# Patient Record
Sex: Female | Born: 2000 | Race: White | Hispanic: No | Marital: Single | Smoking: Never smoker
Health system: Southern US, Community
[De-identification: ages and names within clinical notes are randomized; demographics above are authoritative.]

## PROBLEM LIST (undated history)

## (undated) HISTORY — PX: WISDOM TOOTH EXTRACTION: SHX21

---
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
## 2021-03-31 ENCOUNTER — Encounter (INDEPENDENT_AMBULATORY_CARE_PROVIDER_SITE_OTHER): Payer: Self-pay | Admitting: Pediatrics

## 2021-03-31 ENCOUNTER — Other Ambulatory Visit: Payer: Self-pay

## 2021-03-31 ENCOUNTER — Ambulatory Visit (INDEPENDENT_AMBULATORY_CARE_PROVIDER_SITE_OTHER): Payer: 59 | Admitting: Pediatrics

## 2021-03-31 VITALS — BP 118/72 | HR 76 | Ht 65.95 in | Wt 132.8 lb

## 2021-03-31 DIAGNOSIS — Z23 Encounter for immunization: Secondary | ICD-10-CM

## 2021-03-31 DIAGNOSIS — N926 Irregular menstruation, unspecified: Secondary | ICD-10-CM | POA: Diagnosis not present

## 2021-03-31 DIAGNOSIS — R7989 Other specified abnormal findings of blood chemistry: Secondary | ICD-10-CM | POA: Diagnosis not present

## 2021-03-31 DIAGNOSIS — Z840 Family history of diseases of the skin and subcutaneous tissue: Secondary | ICD-10-CM

## 2021-03-31 DIAGNOSIS — R768 Other specified abnormal immunological findings in serum: Secondary | ICD-10-CM | POA: Diagnosis not present

## 2021-03-31 NOTE — Progress Notes (Addendum)
Pediatric Endocrinology Consultation Follow-Up Visit  Dorothy, Yoder 2001-03-09  Dorothy Daniel, PA-C  Chief Complaint: enlarged thyroid, elevated TSH, thyroid antibody positive  HPI: Dorothy Yoder is a 20 y.o. female presenting for follow-up of the above concerns.  she attended this visit alone.       1. Dorothy Yoder was initially referred to [facility] ([facility]) in 11/2017 for evaluation of thyromegaly and elevated TSH.  Dorothy Yoder's grandmother first noted her neck was enlarged around July 4th 2019.  Mom discussed this with Dr. Alita Chyle at her [facility] on 10/27/17.  Dr. Alita Chyle noted thyroid was of "generous size" so thyroid function tests were drawn on 10/31/17 showing elevated TSH of 7.73 (0.5-4.3) with normal FT4 of 1 (0.8-1.4). She was referred to [facility] for further evaluation.  At her initial visit on 12/06/17, thyroid function tests were normal (TSH 3.58, FT4 1, T4 7) with mildly positive TPO Ab at 14 and thyroglobulin Ab mildly positive at 3.  Her thyromegaly had improved at that time so clinical monitoring was recommended.  She had worsening thyromegaly 02/2019 with elevated TSH to 7.77 so was started on a low dose of levothyroxine.  This was discontinued shortly thereafter as she develop headaches while taking it and repeat thyroid labs were normal off levothyroxine.  2. Since last visit on 10/21/20, she has been well.    Has felt that thyroid has been the most swollen it has ever been over the past 3 weeks.  Felt like she was choking.  Not super stressed.  Has gotten better in the past 4 days. Prior to nnoting swelling, she did have a cough.  She has been trialed on a low dose of levothyroxine in the past though did not tolerate it.  Mom notes that her aunt has always had a hard time with generic levothyroxine but tolerated synthroid well.   Thyroid symptoms: Heat or cold intolerance: has been cold recently, then waking up sweating.  Hands also more clammy. Weight changes:  Weight has increased 6lb since last visit. Has been eating more than normal.   Energy level: has been more sleepy Sleep: napping much more often Skin changes: More dry Constipation/Diarrhea: + diarrhea x 1 week, then constipated.  Pain in L abd.  Drinking same amount as usual.  Difficulty swallowing: Problems swallowing as above.   Neck swelling: See above Periods regular: on OCPs, see below  There is a family history of a maternal aunt who is on Synthroid for hypothyroidism after receiving radiation treatment for lymphoma.  A separate maternal aunt has had several positive autoimmune markers though no specific diagnosis has been made.  Dad also has psoriatic arthritis treated with humira.  Continues on OCPs.  Last period was late, then lasted 12 days.  She has not been taking her OCPs as consistently as she should be.  ROS: All systems reviewed with pertinent positives listed below; otherwise negative. Vaccinated against influenza: no; wants a flu shot today.  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  Past Medical History:  History reviewed. No pertinent past medical history.   Hx of thyromegaly and elevated TSH with positive thyroid antibodies  Meds: Outpatient Encounter Medications as of 03/31/2021  Medication Sig   clindamycin (CLINDAGEL) 1 % gel APPLY A THIN LAYER TO THE AFFECTED AREA ONCE A DAY IN THE MORNING   Clobetasol Propionate 0.05 % shampoo Apply topically daily as needed.   norethindrone-ethinyl estradiol-iron (LOESTRIN FE) 1.5-30 MG-MCG tablet Take 1 tablet  by mouth daily.   tretinoin (RETIN-A) 0.025 % cream Apply topically.   No facility-administered encounter medications on file as of 03/31/2021.   Allergies: No Known Allergies  Surgical History: Past Surgical History:  Procedure Laterality Date   WISDOM TOOTH EXTRACTION     Family History:  Family History  Problem Relation Age of Onset   Psoriasis Father    Hypothyroidism Maternal  Aunt        Hypothyroid diagnosed after receiving radiation for lymphoma   Hypertension Maternal Grandmother    Heart disease Maternal Grandfather    Diabetes Maternal Grandfather    Hypertension Paternal Grandmother    Dad with psoriatic arthritis treated with humira  Social History: Lives with: Mother, father, 2 younger brothers Sophomore at [employer], joined a sorority.  Physical Exam:  Vitals:   03/31/21 1012  BP: 118/72  Pulse: 76  Weight: 132 lb 12.8 oz (60.2 kg)  Height: 5' 5.95" (1.675 m)   BP 118/72    Pulse 76    Ht 5' 5.95" (1.675 m)    Wt 132 lb 12.8 oz (60.2 kg)    LMP 03/13/2021    BMI 21.47 kg/m  Body mass index: body mass index is 21.47 kg/m. Blood pressure percentiles are not available for patients who are 18 years or older.  Wt Readings from Last 3 Encounters:  03/31/21 132 lb 12.8 oz (60.2 kg) (60 %, Z= 0.24)*  10/21/20 126 lb 9.6 oz (57.4 kg) (50 %, Z= 0.01)*  04/17/20 126 lb 12.8 oz (57.5 kg) (53 %, Z= 0.08)*   * Growth percentiles are based on CDC (Girls, 2-20 Years) data.   Ht Readings from Last 3 Encounters:  03/31/21 5' 5.95" (1.675 m) (74 %, Z= 0.65)*  10/21/20 5' 5.79" (1.671 m) (72 %, Z= 0.59)*  11/20/19 5' 5.83" (1.672 m) (73 %, Z= 0.63)*   * Growth percentiles are based on CDC (Girls, 2-20 Years) data.   Body mass index is 21.47 kg/m.  60 %ile (Z= 0.24) based on CDC (Girls, 2-20 Years) weight-for-age data using vitals from 03/31/2021. 74 %ile (Z= 0.65) based on CDC (Girls, 2-20 Years) Stature-for-age data based on Stature recorded on 03/31/2021.   General: Well developed, well nourished female in no acute distress.  Appears stated age Head: Normocephalic, atraumatic.   Eyes:  Pupils equal and round. EOMI.   Sclera white.  No eye drainage.   Ears/Nose/Mouth/Throat: Masked Neck: supple, no cervical lymphadenopathy, thyroid palpable with soft texture, slightly enlarged, nontender. Cardiovascular: regular rate, normal S1/S2, no  murmurs Respiratory: No increased work of breathing.  Lungs clear to auscultation bilaterally.  No wheezes. Extremities: warm, well perfused, cap refill < 2 sec.   Musculoskeletal: Normal muscle mass.  Normal strength Skin: warm, dry.  No rash or lesions. Neurologic: alert and oriented, normal speech, no tremor    Laboratory Evaluation:  Latest Reference Range & Units 11/16/18 10:19 02/27/19 10:04 06/05/19 14:03 11/20/19 11:50 04/17/20 11:39 10/21/20 10:45  TSH mIU/L 2.50 7.77 (H) 2.87 3.09 5.72 (H) 2.25  T4,Free(Direct) 0.8 - 1.4 ng/dL 1.1 0.9 1.1 1.2 1.3 1.3  Thyroxine (T4) 5.3 - 11.7 mcg/dL 7.9 6.0 7.4 9.0 39.7 (H)   (H): Data is abnormally high  Assessment/Plan: Astou C Balbach is a 20 y.o. female with history of thyromegaly and past mild elevation in TSH who is clinically hypothyroid today with mild thyromegaly.   She does have a history of mildly positive TPO Ab and thyroglobulin Ab with family history of autoimmunity (father  with psoriatic arthritis).  Has fluctuating episodes of thyromegaly/slightly elevated TSH that wax and wane, likely due to Hashimoto's.  She is having some menstrual irregularities on current OCPs though is not taking them as consistently as she should.  1. Thyroid antibody positive 2. Elevated TSH 3. Family hx of psoriatic arthritis -Will draw TSH, FT4 today -Will consider low dose of brand name synthroid should labs be consistent with hypothyroidism  4. Irregular periods -Continue current OCPs.  5. Need for immunization against influenza -Flu shot given today  Follow-up:   Return in about 5 months (around 08/29/2021).   >30 minutes spent today reviewing the medical chart, counseling the patient/family, and documenting today's encounter.  Dorothy Needle, MD  -------------------------------- 04/01/21 4:16 PM ADDENDUM: Results for orders placed or performed in visit on 03/31/21  T4, free  Result Value Ref Range   Free T4 1.3 0.8 - 1.4 ng/dL   TSH  Result Value Ref Range   TSH 3.34 mIU/L  Sent the following mychart message: Hi Dorothy Yoder, Your thyroid labs are normal. I do not think you need any treatment at this time.  Please let me know when you have increase in your thyroid size and we can draw labs during the episode.  Please let me know if you have questions! Dr. Larinda Buttery

## 2021-03-31 NOTE — Progress Notes (Signed)
Name of Medication: Flu Vaccine  NDC number: 35701-779-39  Lot Number: [id number]  Expiration Date: 10/09/2021  Who administered the injection? (NAME/ TITLE) Pollie Friar, CMA AAMA Administration Site: Left Deltoid   Patient supplied: No   Was the patient observed for 10-15 minutes after injection was given? Yes If not, why?  Was there an adverse reaction after giving medication? No If yes, what reaction?

## 2021-03-31 NOTE — Patient Instructions (Addendum)
It was a pleasure to see you in clinic today.   Feel free to contact our office during normal business hours at [phone] with questions or concerns. If you need us urgently after normal business hours, please call the above number to reach our answering service who will contact the on-call pediatric endocrinologist.  If you choose to communicate with us via MyChart, please do not send urgent messages as this inbox is NOT monitored on nights or weekends.  Urgent concerns should be discussed with the on-call pediatric endocrinologist.  I will be in touch with lab results 

## 2021-04-01 LAB — TSH: TSH: 3.34 mIU/L

## 2021-04-01 LAB — T4, FREE: Free T4: 1.3 ng/dL (ref 0.8–1.4)

## 2021-09-01 ENCOUNTER — Ambulatory Visit (INDEPENDENT_AMBULATORY_CARE_PROVIDER_SITE_OTHER): Payer: 59 | Admitting: Pediatrics

## 2021-09-01 NOTE — Progress Notes (Unsigned)
Pediatric Endocrinology Consultation Follow-Up Visit  Dorothy Yoder, Dorothy Yoder 10-02-2000  [name], PA-C  Chief Complaint: enlarged thyroid, elevated TSH, thyroid antibody positive  HPI: Dorothy Yoder is a 21 y.o. female presenting for follow-up of the above concerns.  she attended this visit ***alone.       1. Laurann was initially referred to [facility] ([facility]) in 11/2017 for evaluation of thyromegaly and elevated TSH.  Alayne's grandmother first noted her neck was enlarged around July 4th 2019.  Mom discussed this with Dr. Alita Chyle at her [facility] on 10/27/17.  Dr. Alita Chyle noted thyroid was of "generous size" so thyroid function tests were drawn on 10/31/17 showing elevated TSH of 7.73 (0.5-4.3) with normal FT4 of 1 (0.8-1.4). She was referred to [facility] for further evaluation.  At her initial visit on 12/06/17, thyroid function tests were normal (TSH 3.58, FT4 1, T4 7) with mildly positive TPO Ab at 14 and thyroglobulin Ab mildly positive at 3.  Her thyromegaly had improved at that time so clinical monitoring was recommended.  She had worsening thyromegaly 02/2019 with elevated TSH to 7.77 so was started on a low dose of levothyroxine.  This was discontinued shortly thereafter as she develop headaches while taking it and repeat thyroid labs were normal off levothyroxine.  2. Since last visit on 03/31/21, she has been well.    ***  She has been trialed on a low dose of levothyroxine in the past though did not tolerate it.  Mom notes that her aunt has always had a hard time with generic levothyroxine but tolerated synthroid well.   Thyroid symptoms: Heat or cold intolerance: *** Weight changes: Weight has ***creased ***lb since last visit.  Energy level: *** Sleep: *** Skin changes: *** Constipation/Diarrhea: *** Difficulty swallowing: *** Neck swelling: *** ***Periods regular: On OCPs Tremor: *** Palpitations: ***   There is a family history of a maternal aunt who  is on Synthroid for hypothyroidism after receiving radiation treatment for lymphoma.  A separate maternal aunt has had several positive autoimmune markers though no specific diagnosis has been made.  Dad also has psoriatic arthritis treated with humira.  Periods: Continues on OCPs. ***  ROS: All systems reviewed with pertinent positives listed below; otherwise negative. Constitutional: Weight has ***creased ***lb since last visit.      Past Medical History:  No past medical history on file.   Hx of thyromegaly and elevated TSH with positive thyroid antibodies  Meds: Outpatient Encounter Medications as of 09/01/2021  Medication Sig   clindamycin (CLINDAGEL) 1 % gel APPLY A THIN LAYER TO THE AFFECTED AREA ONCE A DAY IN THE MORNING   Clobetasol Propionate 0.05 % shampoo Apply topically daily as needed.   norethindrone-ethinyl estradiol-iron (LOESTRIN FE) 1.5-30 MG-MCG tablet Take 1 tablet by mouth daily.   tretinoin (RETIN-A) 0.025 % cream Apply topically.   No facility-administered encounter medications on file as of 09/01/2021.   Allergies: No Known Allergies  Surgical History: Past Surgical History:  Procedure Laterality Date   WISDOM TOOTH EXTRACTION     Family History:  Family History  Problem Relation Age of Onset   Psoriasis Father    Hypothyroidism Maternal Aunt        Hypothyroid diagnosed after receiving radiation for lymphoma   Hypertension Maternal Grandmother    Heart disease Maternal Grandfather    Diabetes Maternal Grandfather    Hypertension Paternal Grandmother    Dad with psoriatic arthritis treated with humira  Social History: Lives with: Mother, father,  2 younger brothers Sophomore at [employer], joined a sorority.  Physical Exam:  There were no vitals filed for this visit.  There were no vitals taken for this visit. Body mass index: body mass index is unknown because there is no height or weight on file. Blood pressure percentiles are not available for  patients who are 18 years or older.  Wt Readings from Last 3 Encounters:  03/31/21 132 lb 12.8 oz (60.2 kg) (60 %, Z= 0.24)*  10/21/20 126 lb 9.6 oz (57.4 kg) (50 %, Z= 0.01)*  04/17/20 126 lb 12.8 oz (57.5 kg) (53 %, Z= 0.08)*   * Growth percentiles are based on CDC (Girls, 2-20 Years) data.   Ht Readings from Last 3 Encounters:  03/31/21 5' 5.95" (1.675 m) (74 %, Z= 0.65)*  10/21/20 5' 5.79" (1.671 m) (72 %, Z= 0.59)*  11/20/19 5' 5.83" (1.672 m) (73 %, Z= 0.63)*   * Growth percentiles are based on CDC (Girls, 2-20 Years) data.   There is no height or weight on file to calculate BMI.  No weight on file for this encounter. No height on file for this encounter.   General: Well developed, well nourished ***female in no acute distress.  Appears *** stated age Head: Normocephalic, atraumatic.   Eyes:  Pupils equal and round. EOMI.   Sclera white.  No eye drainage.   Ears/Nose/Mouth/Throat: Nares patent, no nasal drainage.  Moist mucous membranes, normal dentition Neck: supple, no cervical lymphadenopathy, no thyromegaly Cardiovascular: regular rate, normal S1/S2, no murmurs Respiratory: No increased work of breathing.  Lungs clear to auscultation bilaterally.  No wheezes. Abdomen: soft, nontender, nondistended.  Extremities: warm, well perfused, cap refill < 2 sec.   Musculoskeletal: Normal muscle mass.  Normal strength Skin: warm, dry.  No rash or lesions. Neurologic: alert and oriented, normal speech, no tremor    Laboratory Evaluation:  Latest Reference Range & Units 11/16/18 10:19 02/27/19 10:04 06/05/19 14:03 11/20/19 11:50 04/17/20 11:39 10/21/20 10:45  TSH mIU/L 2.50 7.77 (H) 2.87 3.09 5.72 (H) 2.25  T4,Free(Direct) 0.8 - 1.4 ng/dL 1.1 0.9 1.1 1.2 1.3 1.3  Thyroxine (T4) 5.3 - 11.7 mcg/dL 7.9 6.0 7.4 9.0 97.4 (H)   (H): Data is abnormally high  Assessment/Plan:*** [name] is a 21 y.o. female with history of thyromegaly and past mild elevation in TSH who is  clinically hypothyroid today with mild thyromegaly.   She does have a history of mildly positive TPO Ab and thyroglobulin Ab with family history of autoimmunity (father with psoriatic arthritis).  Has fluctuating episodes of thyromegaly/slightly elevated TSH that wax and wane, likely due to Hashimoto's.  She is having some menstrual irregularities on current OCPs though is not taking them as consistently as she should.  1. Thyroid antibody positive*** 2. Elevated TSH 3. Family hx of psoriatic arthritis -Will draw TSH, FT4 today -Will consider low dose of brand name synthroid should labs be consistent with hypothyroidism  4. Irregular periods -Continue current OCPs.   Follow-up:   No follow-ups on file.   ***  Casimiro Needle, MD

## 2021-09-01 NOTE — Patient Instructions (Incomplete)

## 2021-12-03 ENCOUNTER — Ambulatory Visit (INDEPENDENT_AMBULATORY_CARE_PROVIDER_SITE_OTHER): Payer: 59 | Admitting: Pediatrics

## 2022-11-03 ENCOUNTER — Telehealth (INDEPENDENT_AMBULATORY_CARE_PROVIDER_SITE_OTHER): Payer: Self-pay | Admitting: Pediatrics

## 2022-11-03 DIAGNOSIS — R768 Other specified abnormal immunological findings in serum: Secondary | ICD-10-CM

## 2022-11-03 NOTE — Telephone Encounter (Signed)
  Name of who is calling: [place]  Caller's Relationship to Patient:self  Best contact [phone]  Provider they see: [email or web address]  Reason for call: Since Dorothy Yoder is now 22 she is in need of an adult endo referral and was wondering if Dr. Larinda Buttery could put one in for her to [facility]. Please follow up    PRESCRIPTION REFILL ONLY  Name of prescription:  Pharmacy:

## 2022-11-03 NOTE — Telephone Encounter (Signed)
Called patient to update that Dr. Larinda Buttery is out of the office this week until tomorrow.  I will route this to her.  Patient stated that they told her they have 3 providers now accepting new patients and would like to try to get scheduled there.

## 2022-11-08 ENCOUNTER — Encounter (INDEPENDENT_AMBULATORY_CARE_PROVIDER_SITE_OTHER): Payer: Self-pay

## 2022-11-08 NOTE — Telephone Encounter (Signed)
Returned call, left VM that referral to [facility] has been placed, to call back if she has any questions.

## 2023-04-27 ENCOUNTER — Encounter (INDEPENDENT_AMBULATORY_CARE_PROVIDER_SITE_OTHER): Payer: Self-pay

## 2023-12-27 ENCOUNTER — Encounter (INDEPENDENT_AMBULATORY_CARE_PROVIDER_SITE_OTHER): Payer: Self-pay
